# Patient Record
Sex: Female | Born: 1950 | Race: Black or African American | Hispanic: No | Marital: Married | Smoking: Never smoker
Health system: Southern US, Community
[De-identification: ages and names within clinical notes are randomized; demographics above are authoritative.]

## PROBLEM LIST (undated history)

## (undated) DIAGNOSIS — C50919 Malignant neoplasm of unspecified site of unspecified female breast: Secondary | ICD-10-CM

---

## 2000-03-03 HISTORY — PX: MASTECTOMY: SHX3

## 2004-02-15 ENCOUNTER — Ambulatory Visit: Payer: Self-pay | Admitting: Oncology

## 2004-07-17 ENCOUNTER — Ambulatory Visit: Payer: Self-pay

## 2004-09-10 ENCOUNTER — Ambulatory Visit: Payer: Self-pay | Admitting: Oncology

## 2005-03-19 ENCOUNTER — Ambulatory Visit: Payer: Self-pay | Admitting: Oncology

## 2005-09-17 ENCOUNTER — Ambulatory Visit: Payer: Self-pay | Admitting: Oncology

## 2005-10-08 ENCOUNTER — Ambulatory Visit: Payer: Self-pay

## 2005-12-30 ENCOUNTER — Ambulatory Visit: Payer: Self-pay | Admitting: Oncology

## 2006-05-02 ENCOUNTER — Ambulatory Visit: Payer: Self-pay | Admitting: Oncology

## 2006-05-11 ENCOUNTER — Ambulatory Visit: Payer: Self-pay | Admitting: Oncology

## 2006-06-02 ENCOUNTER — Ambulatory Visit: Payer: Self-pay | Admitting: Oncology

## 2006-09-08 ENCOUNTER — Ambulatory Visit: Payer: Self-pay | Admitting: Oncology

## 2006-10-02 ENCOUNTER — Ambulatory Visit: Payer: Self-pay | Admitting: Oncology

## 2006-10-06 ENCOUNTER — Ambulatory Visit: Payer: Self-pay

## 2007-03-04 ENCOUNTER — Ambulatory Visit: Payer: Self-pay | Admitting: Oncology

## 2007-03-08 ENCOUNTER — Ambulatory Visit: Payer: Self-pay | Admitting: Oncology

## 2007-04-04 ENCOUNTER — Ambulatory Visit: Payer: Self-pay | Admitting: Oncology

## 2007-08-02 ENCOUNTER — Ambulatory Visit: Payer: Self-pay | Admitting: Oncology

## 2007-08-24 ENCOUNTER — Ambulatory Visit: Payer: Self-pay | Admitting: Oncology

## 2007-09-01 ENCOUNTER — Ambulatory Visit: Payer: Self-pay | Admitting: Oncology

## 2007-10-11 ENCOUNTER — Ambulatory Visit: Payer: Self-pay | Admitting: Oncology

## 2008-02-01 ENCOUNTER — Ambulatory Visit: Payer: Self-pay | Admitting: Oncology

## 2008-02-08 ENCOUNTER — Ambulatory Visit: Payer: Self-pay | Admitting: Oncology

## 2008-03-03 ENCOUNTER — Ambulatory Visit: Payer: Self-pay | Admitting: Oncology

## 2008-08-01 ENCOUNTER — Ambulatory Visit: Payer: Self-pay | Admitting: Oncology

## 2008-08-31 ENCOUNTER — Ambulatory Visit: Payer: Self-pay | Admitting: Oncology

## 2008-11-28 ENCOUNTER — Ambulatory Visit: Payer: Self-pay

## 2008-12-01 ENCOUNTER — Ambulatory Visit: Payer: Self-pay | Admitting: Unknown Physician Specialty

## 2009-01-31 ENCOUNTER — Ambulatory Visit: Payer: Self-pay | Admitting: Oncology

## 2009-02-12 ENCOUNTER — Ambulatory Visit: Payer: Self-pay | Admitting: Oncology

## 2009-03-03 ENCOUNTER — Ambulatory Visit: Payer: Self-pay | Admitting: Oncology

## 2009-08-01 ENCOUNTER — Ambulatory Visit: Payer: Self-pay | Admitting: Internal Medicine

## 2009-08-13 ENCOUNTER — Ambulatory Visit: Payer: Self-pay | Admitting: Oncology

## 2009-08-31 ENCOUNTER — Ambulatory Visit: Payer: Self-pay | Admitting: Oncology

## 2009-08-31 ENCOUNTER — Ambulatory Visit: Payer: Self-pay | Admitting: Internal Medicine

## 2009-11-28 ENCOUNTER — Ambulatory Visit: Payer: Self-pay

## 2010-02-13 ENCOUNTER — Ambulatory Visit: Payer: Self-pay | Admitting: Oncology

## 2010-02-14 LAB — CANCER ANTIGEN 27.29: CA 27.29: 13.4 U/mL (ref 0.0–38.6)

## 2010-03-03 ENCOUNTER — Ambulatory Visit: Payer: Self-pay | Admitting: Oncology

## 2010-12-03 ENCOUNTER — Ambulatory Visit: Payer: Self-pay

## 2011-02-21 ENCOUNTER — Ambulatory Visit: Payer: Self-pay | Admitting: Oncology

## 2011-02-22 LAB — CANCER ANTIGEN 27.29: CA 27.29: 12.7 U/mL (ref 0.0–38.6)

## 2011-03-04 ENCOUNTER — Ambulatory Visit: Payer: Self-pay | Admitting: Oncology

## 2011-12-04 ENCOUNTER — Ambulatory Visit: Payer: Self-pay

## 2012-02-27 ENCOUNTER — Ambulatory Visit: Payer: Self-pay | Admitting: Oncology

## 2012-02-27 LAB — CBC CANCER CENTER
Basophil #: 0 x10 3/mm (ref 0.0–0.1)
Basophil %: 0.4 %
Eosinophil #: 0.1 x10 3/mm (ref 0.0–0.7)
Lymphocyte #: 2.5 x10 3/mm (ref 1.0–3.6)
MCH: 28.9 pg (ref 26.0–34.0)
MCHC: 33.7 g/dL (ref 32.0–36.0)
MCV: 86 fL (ref 80–100)
Monocyte #: 0.6 x10 3/mm (ref 0.2–0.9)
Monocyte %: 8.8 %
Neutrophil #: 3.3 x10 3/mm (ref 1.4–6.5)
Platelet: 218 x10 3/mm (ref 150–440)
RDW: 13.2 % (ref 11.5–14.5)
WBC: 6.5 x10 3/mm (ref 3.6–11.0)

## 2012-02-27 LAB — COMPREHENSIVE METABOLIC PANEL
Albumin: 3.4 g/dL (ref 3.4–5.0)
Anion Gap: 2 — ABNORMAL LOW (ref 7–16)
BUN: 16 mg/dL (ref 7–18)
Bilirubin,Total: 0.4 mg/dL (ref 0.2–1.0)
Chloride: 103 mmol/L (ref 98–107)
Co2: 34 mmol/L — ABNORMAL HIGH (ref 21–32)
Creatinine: 0.93 mg/dL (ref 0.60–1.30)
EGFR (African American): >60
EGFR (Non-African Amer.): >60
Potassium: 3.7 mmol/L (ref 3.5–5.1)
SGPT (ALT): 29 U/L (ref 12–78)
Sodium: 139 mmol/L (ref 136–145)
Total Protein: 7.8 g/dL (ref 6.4–8.2)

## 2012-03-03 ENCOUNTER — Ambulatory Visit: Payer: Self-pay | Admitting: Oncology

## 2012-12-08 ENCOUNTER — Ambulatory Visit: Payer: Self-pay

## 2014-01-11 ENCOUNTER — Ambulatory Visit: Payer: Self-pay

## 2014-01-24 ENCOUNTER — Ambulatory Visit: Payer: Self-pay

## 2014-11-27 ENCOUNTER — Other Ambulatory Visit: Payer: Self-pay | Admitting: Family Medicine

## 2014-11-27 DIAGNOSIS — Z780 Asymptomatic menopausal state: Secondary | ICD-10-CM

## 2014-11-27 DIAGNOSIS — Z0000 Encounter for general adult medical examination without abnormal findings: Secondary | ICD-10-CM

## 2015-01-15 ENCOUNTER — Other Ambulatory Visit: Payer: Self-pay | Admitting: Family Medicine

## 2015-01-15 ENCOUNTER — Ambulatory Visit
Admission: RE | Admit: 2015-01-15 | Discharge: 2015-01-15 | Disposition: A | Discharge status: Home / Self Care | Payer: Medicare Other | Source: Ambulatory Visit | Attending: Family Medicine | Admitting: Family Medicine

## 2015-01-15 DIAGNOSIS — Z780 Asymptomatic menopausal state: Secondary | ICD-10-CM | POA: Diagnosis not present

## 2015-01-15 DIAGNOSIS — Z0000 Encounter for general adult medical examination without abnormal findings: Secondary | ICD-10-CM

## 2015-01-15 DIAGNOSIS — Z1231 Encounter for screening mammogram for malignant neoplasm of breast: Secondary | ICD-10-CM | POA: Insufficient documentation

## 2015-01-15 HISTORY — DX: Malignant neoplasm of unspecified site of unspecified female breast: C50.919

## 2015-12-19 ENCOUNTER — Other Ambulatory Visit: Payer: Self-pay | Admitting: Family Medicine

## 2015-12-19 DIAGNOSIS — Z1231 Encounter for screening mammogram for malignant neoplasm of breast: Secondary | ICD-10-CM

## 2016-01-22 ENCOUNTER — Ambulatory Visit
Admission: RE | Admit: 2016-01-22 | Discharge: 2016-01-22 | Disposition: A | Discharge status: Home / Self Care | Payer: Medicare Other | Source: Ambulatory Visit | Attending: Family Medicine | Admitting: Family Medicine

## 2016-01-22 DIAGNOSIS — Z1231 Encounter for screening mammogram for malignant neoplasm of breast: Secondary | ICD-10-CM | POA: Diagnosis not present

## 2016-01-22 DIAGNOSIS — Z853 Personal history of malignant neoplasm of breast: Secondary | ICD-10-CM | POA: Insufficient documentation

## 2016-12-09 ENCOUNTER — Other Ambulatory Visit: Payer: Self-pay | Admitting: Family Medicine

## 2016-12-09 DIAGNOSIS — Z1231 Encounter for screening mammogram for malignant neoplasm of breast: Secondary | ICD-10-CM

## 2017-01-26 ENCOUNTER — Ambulatory Visit
Admission: RE | Admit: 2017-01-26 | Discharge: 2017-01-26 | Disposition: A | Discharge status: Home / Self Care | Payer: Medicare Other | Source: Ambulatory Visit | Attending: Family Medicine | Admitting: Family Medicine

## 2017-01-26 DIAGNOSIS — Z1231 Encounter for screening mammogram for malignant neoplasm of breast: Secondary | ICD-10-CM | POA: Diagnosis present

## 2017-12-23 ENCOUNTER — Other Ambulatory Visit: Payer: Self-pay | Admitting: Family Medicine

## 2017-12-23 DIAGNOSIS — Z1231 Encounter for screening mammogram for malignant neoplasm of breast: Secondary | ICD-10-CM

## 2018-01-27 ENCOUNTER — Ambulatory Visit
Admission: RE | Admit: 2018-01-27 | Discharge: 2018-01-27 | Disposition: A | Discharge status: Home / Self Care | Payer: Medicare Other | Source: Ambulatory Visit | Attending: Family Medicine | Admitting: Family Medicine

## 2018-01-27 DIAGNOSIS — Z1231 Encounter for screening mammogram for malignant neoplasm of breast: Secondary | ICD-10-CM

## 2018-12-23 ENCOUNTER — Other Ambulatory Visit: Payer: Self-pay | Admitting: Family Medicine

## 2018-12-23 DIAGNOSIS — Z1231 Encounter for screening mammogram for malignant neoplasm of breast: Secondary | ICD-10-CM

## 2019-01-31 ENCOUNTER — Ambulatory Visit
Admission: RE | Admit: 2019-01-31 | Discharge: 2019-01-31 | Disposition: A | Discharge status: Home / Self Care | Payer: Medicare Other | Source: Ambulatory Visit | Attending: Family Medicine | Admitting: Family Medicine

## 2019-01-31 DIAGNOSIS — Z1231 Encounter for screening mammogram for malignant neoplasm of breast: Secondary | ICD-10-CM | POA: Diagnosis not present

## 2019-12-19 ENCOUNTER — Other Ambulatory Visit: Payer: Self-pay | Admitting: Family Medicine

## 2019-12-19 DIAGNOSIS — Z1231 Encounter for screening mammogram for malignant neoplasm of breast: Secondary | ICD-10-CM

## 2020-02-01 ENCOUNTER — Other Ambulatory Visit: Payer: Self-pay

## 2020-02-01 ENCOUNTER — Ambulatory Visit
Admission: RE | Admit: 2020-02-01 | Discharge: 2020-02-01 | Disposition: A | Discharge status: Home / Self Care | Payer: Medicare Other | Source: Ambulatory Visit | Attending: Family Medicine | Admitting: Family Medicine

## 2020-02-01 DIAGNOSIS — Z1231 Encounter for screening mammogram for malignant neoplasm of breast: Secondary | ICD-10-CM | POA: Diagnosis not present

## 2020-12-20 ENCOUNTER — Other Ambulatory Visit: Payer: Self-pay | Admitting: Family Medicine

## 2020-12-20 DIAGNOSIS — Z1231 Encounter for screening mammogram for malignant neoplasm of breast: Secondary | ICD-10-CM

## 2021-02-01 ENCOUNTER — Ambulatory Visit
Admission: RE | Admit: 2021-02-01 | Discharge: 2021-02-01 | Disposition: A | Discharge status: Home / Self Care | Payer: Medicare HMO | Source: Ambulatory Visit | Attending: Family Medicine | Admitting: Family Medicine

## 2021-02-01 ENCOUNTER — Other Ambulatory Visit: Payer: Self-pay

## 2021-02-01 DIAGNOSIS — Z1231 Encounter for screening mammogram for malignant neoplasm of breast: Secondary | ICD-10-CM | POA: Diagnosis present

## 2021-03-15 IMAGING — MG DIGITAL SCREENING UNILAT RIGHT W/ TOMO W/ CAD
4 series · 4 of 12 positions shown · non-contrast
Comparison: Previous exam(s).

CLINICAL DATA: Screening.

EXAM:
DIGITAL SCREENING UNILATERAL RIGHT MAMMOGRAM WITH CAD AND TOMO

[R CC synth-2D]
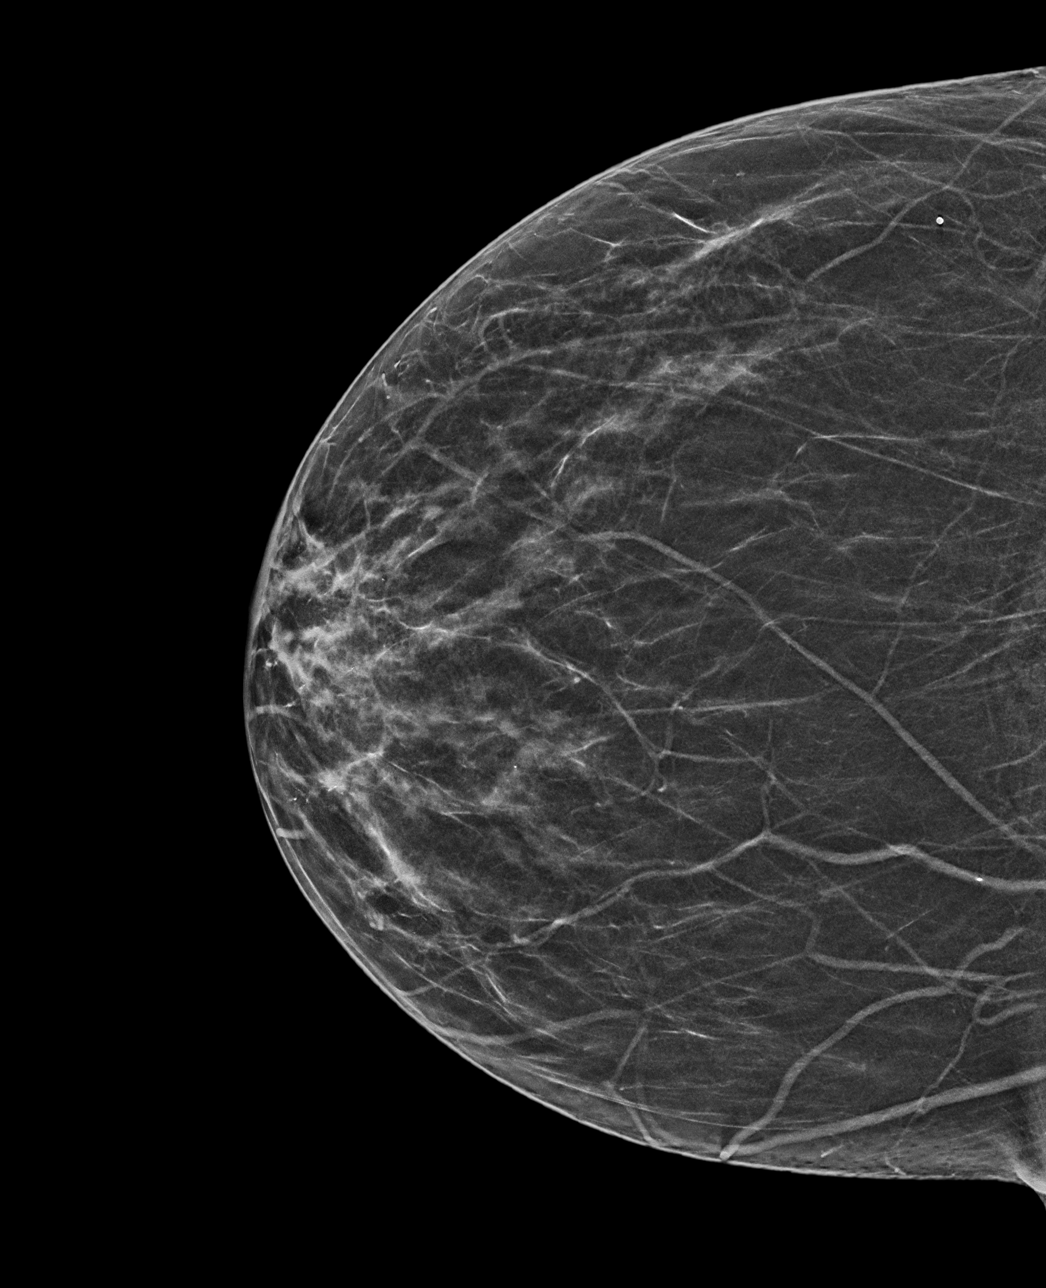

[R MLO synth-2D]
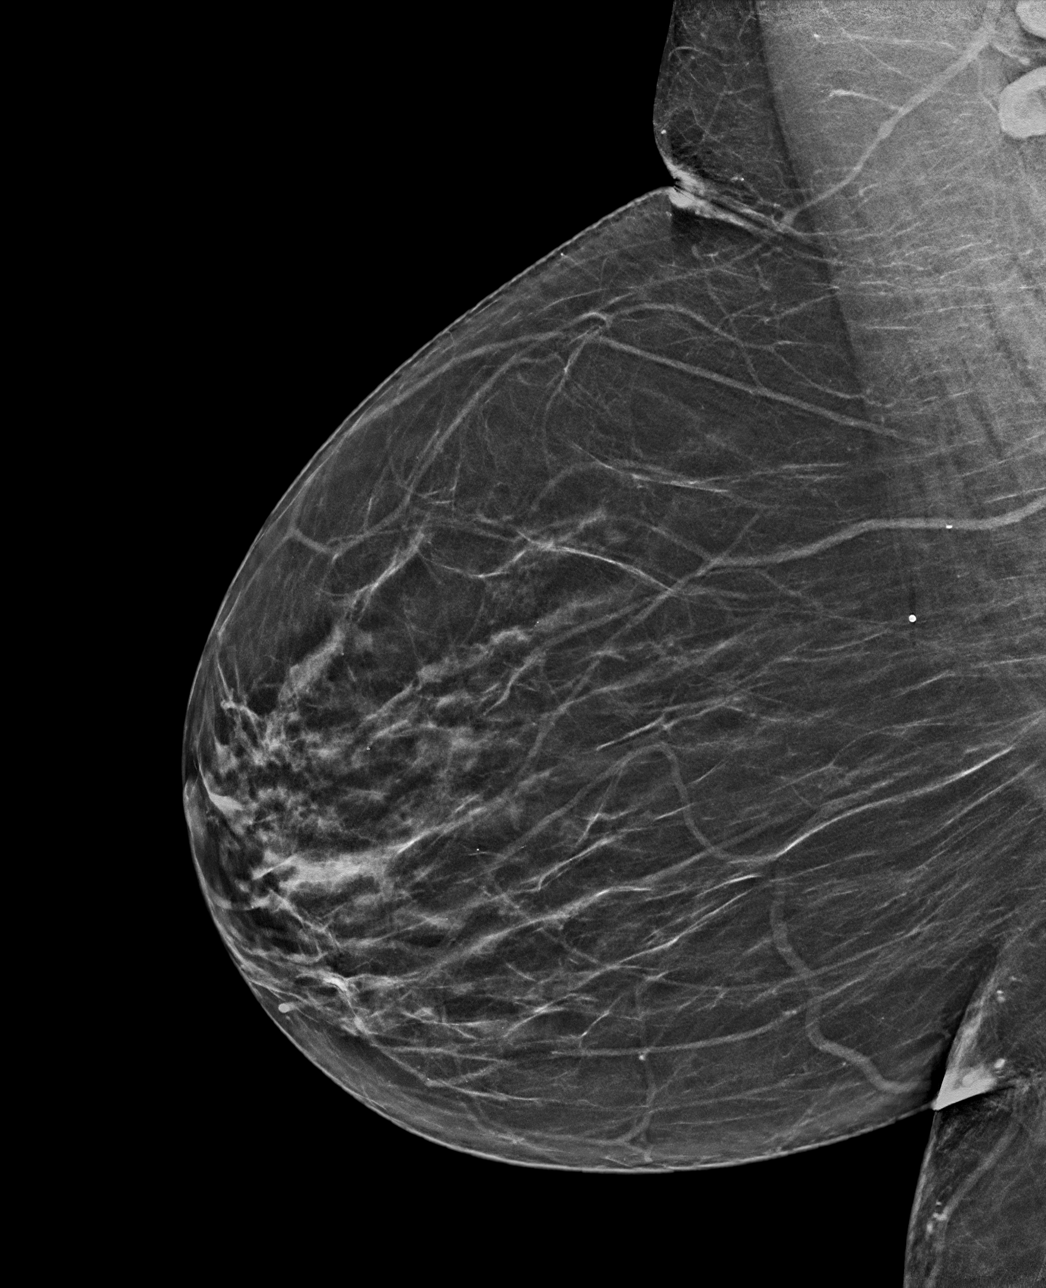

[R CC tomo · tomo slice 25/50.0]
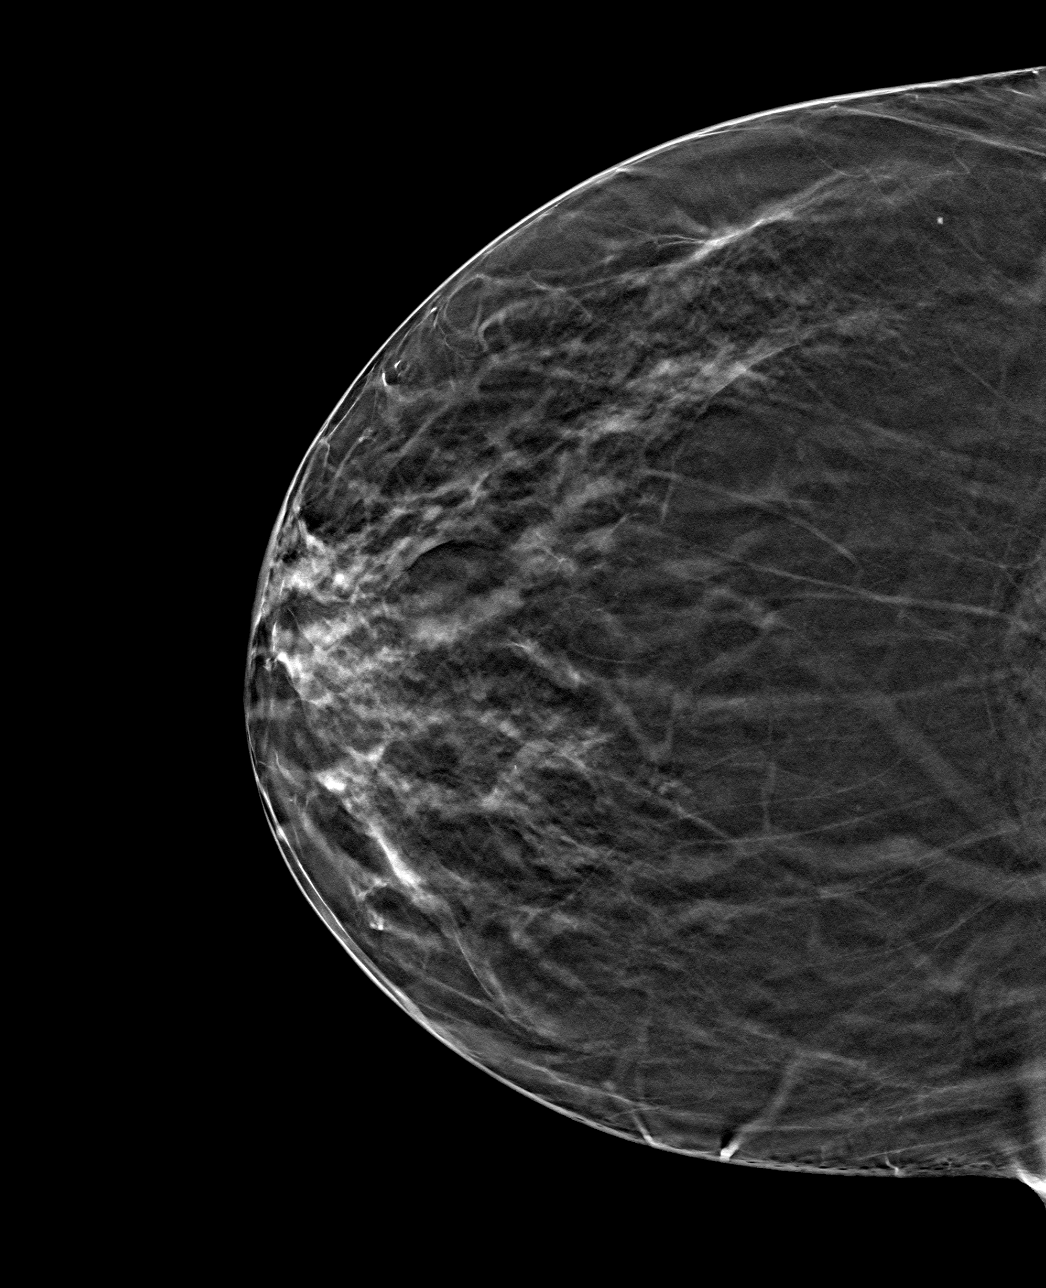

[R MLO tomo · tomo slice 30/59.0]
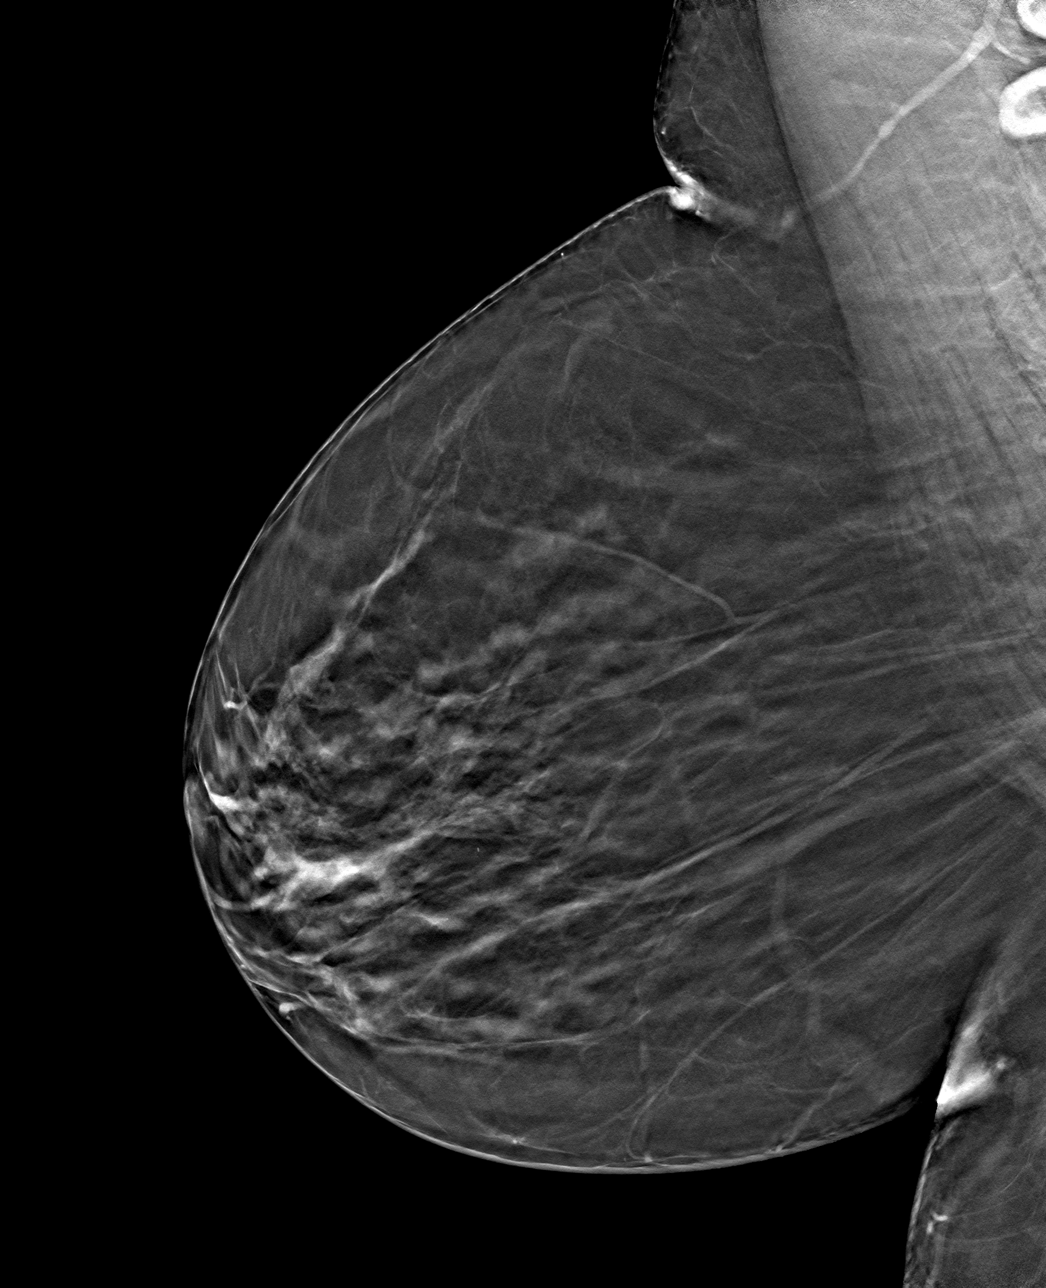

[4 of 12 positions shown; findings below may reference images not displayed]

ACR Breast Density Category b: There are scattered areas of
fibroglandular density.
FINDINGS: The patient has had a left mastectomy. There are no findings
suspicious for malignancy. Images were processed with CAD.
IMPRESSION: No mammographic evidence of malignancy. A result letter of this
screening mammogram will be mailed directly to the patient.

RECOMMENDATION:
Screening mammogram in one year.  (Code:H3-9-9K3)

BI-RADS CATEGORY  1: Negative.

## 2021-12-20 ENCOUNTER — Other Ambulatory Visit: Payer: Self-pay | Admitting: Family Medicine

## 2021-12-20 DIAGNOSIS — Z1231 Encounter for screening mammogram for malignant neoplasm of breast: Secondary | ICD-10-CM

## 2022-02-03 ENCOUNTER — Ambulatory Visit
Admission: RE | Admit: 2022-02-03 | Discharge: 2022-02-03 | Disposition: A | Discharge status: Home / Self Care | Payer: Medicare Other | Source: Ambulatory Visit | Attending: Family Medicine | Admitting: Family Medicine

## 2022-02-03 DIAGNOSIS — Z1231 Encounter for screening mammogram for malignant neoplasm of breast: Secondary | ICD-10-CM | POA: Insufficient documentation

## 2022-05-23 ENCOUNTER — Encounter: Payer: Self-pay | Admitting: *Deleted

## 2022-05-25 NOTE — Progress Notes (Unsigned)
Cardiology Office Note  Date:  05/26/2022   ID:  Caitlin Ramirez, DOB Jul 20, 1950, MRN [id number]  PCP:  Denton Lank, MD   Chief Complaint  Patient presents with   New Patient (Initial Visit)    Ref by Dr. Posey Pronto for heart murmur. Patient is concerned due to her family history of CAD. Medications reviewed by the patient verbally.     HPI:  [name] is a 72 year old woman with past medical history of HTN [place] Hyperlipidemia Who presents by referral from Dr. Posey Pronto for heart murmur  Very anxious about diagnosis of heart murmur In the past, active with no significant restrictions Good ADLs  Does not have a blood pressure cuff at home Reports compliance with her medications, takes them all in the morning including amlodipine, atenolol, losartan, HCTZ, spironolactone Sometimes gets dizzy later in the day  Nonsmoker A1c 7.1  EKG personally reviewed by myself on todays visit Normal sinus rhythm with rate 65 bpm no significant ST-T wave changes  PMH:   has a past medical history of Breast cancer ([place]).  PSH:    Past Surgical History:  Procedure Laterality Date   MASTECTOMY Left 2002   breast ca    Current Outpatient Medications  Medication Sig Dispense Refill   amLODipine-atorvastatin (CADUET) 10-80 MG tablet Take 1 tablet by mouth daily.     aspirin EC 81 MG tablet Take 81 mg by mouth daily.     atenolol (TENORMIN) 25 MG tablet Take 25 mg by mouth daily.     hydrochlorothiazide (HYDRODIURIL) 25 MG tablet Take 25 mg by mouth daily.     JANUMET 50-1000 MG tablet Take 1 tablet by mouth 2 (two) times daily.     JARDIANCE 25 MG TABS tablet Take 25 mg by mouth daily.     losartan (COZAAR) 100 MG tablet Take 100 mg by mouth daily.     omeprazole (PRILOSEC) 20 MG capsule Take 20 mg by mouth daily.     spironolactone (ALDACTONE) 50 MG tablet Take 50 mg by mouth daily.     No current facility-administered medications for this visit.     Allergies:   Patient  has no known allergies.   Social History:  The patient  reports that she has never smoked. She has never used smokeless tobacco. She reports current alcohol use. She reports that she does not currently use drugs.   Family History:   family history includes Breast cancer (age of onset: 26) in her sister; Diabetes in her father; Heart attack (age of onset: 67) in her brother; Heart attack (age of onset: 2) in her father; Heart attack (age of onset: 58) in her brother and mother; Heart disease in her father and mother; Heart failure in her mother; Hyperlipidemia in her brother, brother, and mother; Hypertension in her brother, brother, and mother.    Review of Systems: Review of Systems  Constitutional: Negative.   HENT: Negative.    Respiratory: Negative.    Cardiovascular: Negative.   Gastrointestinal: Negative.   Musculoskeletal: Negative.   Neurological: Negative.   Psychiatric/Behavioral: Negative.    All other systems reviewed and are negative.    PHYSICAL EXAM: VS:  BP 130/80 (BP Location: Left Arm)   Pulse 65   Ht 5\' 4"  (1.626 m)   Wt 181 lb 6 oz (82.3 kg)   SpO2 96%   BMI 31.13 kg/m  , BMI Body mass index is 31.13 kg/m. GEN: Well nourished, well developed, in no acute distress HEENT: normal  Neck: no JVD, carotid bruits, or masses Cardiac: RRR; 2/6 systolic ejection murmur left sternal border, no gallops,no edema  Respiratory:  clear to auscultation bilaterally, normal work of breathing GI: soft, nontender, nondistended, + BS MS: no deformity or atrophy Skin: warm and dry, no rash Neuro:  Strength and sensation are intact Psych: euthymic mood, full affect  Recent Labs: No results found for requested labs within last 365 days.    Lipid Panel No results found for: "CHOL", "HDL", "LDLCALC", "TRIG"    Wt Readings from Last 3 Encounters:  05/26/22 181 lb 6 oz (82.3 kg)       ASSESSMENT AND PLAN:  Problem List Items Addressed This Visit   None Visit  Diagnoses     Cardiac murmur    -  Primary      Essential hypertension We have recommended that she move her losartan and atenolol to the evening, take her other blood pressure medications in the morning Blood pressure cuff and monitor numbers at home given some symptoms of orthostasis, discussed how to check orthostatics at home  Hyperlipidemia On high intensity Lipitor 80 daily Management per primary care  Murmur Echocardiogram ordered, essentially asymptomatic Will rule out structural heart disease, reassurance provided, no restrictions to her activities   Total encounter time more than 50 minutes  Greater than 50% was spent in counseling and coordination of care with the patient    Signed, Esmond Plants, M.D., Ph.D. [place], [place]

## 2022-05-26 ENCOUNTER — Encounter: Payer: Self-pay | Admitting: Cardiovascular Disease

## 2022-05-26 ENCOUNTER — Ambulatory Visit: Payer: Medicare Other | Attending: Cardiovascular Disease | Admitting: Cardiovascular Disease

## 2022-05-26 VITALS — BP 130/80 | HR 65 | Ht 64.0 in | Wt 181.4 lb

## 2022-05-26 DIAGNOSIS — I10 Essential (primary) hypertension: Secondary | ICD-10-CM

## 2022-05-26 DIAGNOSIS — E782 Mixed hyperlipidemia: Secondary | ICD-10-CM

## 2022-05-26 DIAGNOSIS — R011 Cardiac murmur, unspecified: Secondary | ICD-10-CM | POA: Diagnosis not present

## 2022-05-26 NOTE — Patient Instructions (Addendum)
Medication Instructions:  Take the losartan and atenolol in the PM  If you need a refill on your cardiac medications before your next appointment, please call your pharmacy.   Lab work: No new labs needed  Testing/Procedures: Echo for murmur  Your physician has requested that you have an echocardiogram. Echocardiography is a painless test that uses sound waves to create images of your heart. It provides your doctor with information about the size and shape of your heart and how well your heart's chambers and valves are working. This procedure takes approximately one hour. There are no restrictions for this procedure. Please do NOT wear cologne, perfume, aftershave, or lotions (deodorant is allowed). Please arrive 15 minutes prior to your appointment time.   Follow-Up: At [facility], you and your health needs are our priority.  As part of our continuing mission to provide you with exceptional heart care, we have created designated Provider Care Teams.  These Care Teams include your primary Cardiologist (physician) and Advanced Practice Providers (APPs -  Physician Assistants and Nurse Practitioners) who all work together to provide you with the care you need, when you need it.  You will need a follow up appointment as needed  Providers on your designated Care Team:   Murray Hodgkins, NP Christell Faith, PA-C Cadence Kathlen Mody, [place]  COVID-19 Vaccine Information can be found at: [email or web address] For questions related to vaccine distribution or appointments, please email [email or web address] or call [phone].

## 2022-06-10 ENCOUNTER — Ambulatory Visit: Payer: Medicare Other | Attending: Cardiovascular Disease

## 2022-06-10 DIAGNOSIS — R011 Cardiac murmur, unspecified: Secondary | ICD-10-CM | POA: Diagnosis not present

## 2022-06-10 LAB — ECHOCARDIOGRAM COMPLETE
AR max vel: 2.23 cm2
AV Area VTI: 1.98 cm2
AV Area mean vel: 2.06 cm2
AV Mean grad: 3 mmHg
AV Peak grad: 6.5 mmHg
Ao pk vel: 1.27 m/s
Area-P 1/2: 3.17 cm2
Calc EF: 51.6 %
S' Lateral: 3 cm
Single Plane A2C EF: 51.5 %
Single Plane A4C EF: 50.9 %

## 2022-12-23 ENCOUNTER — Other Ambulatory Visit: Payer: Self-pay | Admitting: Family Medicine

## 2022-12-23 DIAGNOSIS — Z1231 Encounter for screening mammogram for malignant neoplasm of breast: Secondary | ICD-10-CM

## 2023-02-05 ENCOUNTER — Ambulatory Visit
Admission: RE | Admit: 2023-02-05 | Discharge: 2023-02-05 | Disposition: A | Discharge status: Home / Self Care | Payer: Medicare Other | Source: Ambulatory Visit | Attending: Family Medicine | Admitting: Family Medicine

## 2023-02-05 ENCOUNTER — Other Ambulatory Visit: Payer: Self-pay | Admitting: Family Medicine

## 2023-02-05 DIAGNOSIS — Z1231 Encounter for screening mammogram for malignant neoplasm of breast: Secondary | ICD-10-CM | POA: Insufficient documentation

## 2023-12-29 ENCOUNTER — Other Ambulatory Visit: Payer: Self-pay | Admitting: Family Medicine

## 2023-12-29 DIAGNOSIS — Z1231 Encounter for screening mammogram for malignant neoplasm of breast: Secondary | ICD-10-CM

## 2024-02-09 ENCOUNTER — Other Ambulatory Visit: Payer: Self-pay | Admitting: Family Medicine

## 2024-02-09 ENCOUNTER — Ambulatory Visit
Admission: RE | Admit: 2024-02-09 | Discharge: 2024-02-09 | Disposition: A | Source: Ambulatory Visit | Attending: Family Medicine | Admitting: Family Medicine

## 2024-02-09 DIAGNOSIS — Z1231 Encounter for screening mammogram for malignant neoplasm of breast: Secondary | ICD-10-CM
